# Patient Record
Sex: Male | Born: 1955 | Race: White | Hispanic: No | Marital: Married | Smoking: Never smoker
Health system: Southern US, Community
[De-identification: ages and names within clinical notes are randomized; demographics above are authoritative.]

## PROBLEM LIST (undated history)

## (undated) DIAGNOSIS — T7840XA Allergy, unspecified, initial encounter: Secondary | ICD-10-CM

## (undated) DIAGNOSIS — E079 Disorder of thyroid, unspecified: Secondary | ICD-10-CM

## (undated) DIAGNOSIS — E039 Hypothyroidism, unspecified: Secondary | ICD-10-CM

## (undated) DIAGNOSIS — I10 Essential (primary) hypertension: Secondary | ICD-10-CM

## (undated) DIAGNOSIS — Z87442 Personal history of urinary calculi: Secondary | ICD-10-CM

## (undated) HISTORY — PX: OTHER SURGICAL HISTORY: SHX169

## (undated) HISTORY — PX: WISDOM TOOTH EXTRACTION: SHX21

## (undated) HISTORY — DX: Allergy, unspecified, initial encounter: T78.40XA

---

## 2001-06-25 ENCOUNTER — Ambulatory Visit (HOSPITAL_BASED_OUTPATIENT_CLINIC_OR_DEPARTMENT_OTHER): Admission: RE | Admit: 2001-06-25 | Discharge: 2001-06-25 | Payer: Self-pay | Admitting: Plastic Surgery

## 2001-06-25 ENCOUNTER — Encounter (INDEPENDENT_AMBULATORY_CARE_PROVIDER_SITE_OTHER): Payer: Self-pay | Admitting: Specialist

## 2010-10-26 ENCOUNTER — Encounter: Payer: Self-pay | Admitting: Cardiovascular Disease

## 2011-05-18 NOTE — Op Note (Signed)
[place]  Patient:    Richard Avery, Richard Avery                        MRN: [id number] Proc. Date: 06/25/01 Attending:  Alfredia Ferguson, M.D.                           Operative Report  PREOPERATIVE DIAGNOSES: 1. A 5 mm pigmented nevus, upper midforehead. 2. A 2 mm pigmented nevus, upper left nasal dorsum. 3. An 8 mm seborrheic keratosis, right temple. 4. Skin tag x 2, left lateral neck.  POSTOPERATIVE DIAGNOSES: 1. A 5 mm pigmented nevus, upper midforehead. 2. A 2 mm pigmented nevus, upper left nasal dorsum. 3. An 8 mm seborrheic keratosis, right temple. 4. Skin tag x 2, left lateral neck.  PROCEDURES: 1. Excision of pigmented nevus, upper midforehead. 2. Excision upper nasal dorsum lesion. 3. Curette and hyfrecation of right temple lesion. 4. Shave excision and cauterization of skin tags x 2, left neck.  SURGEON:  Alfredia Ferguson, M.D.  ANESTHESIA:  2% Xylocaine and 1:100,000 epinephrine.  INDICATION FOR SURGERY:  This is a 55 year old gentleman with multiple lesions he wishes to have removed.  He understands he will be trading what he has for a permanent, potentially unsightly scar.  In spite of that, he wishes to proceed with the surgery.  DESCRIPTION OF PROCEDURE:  Skin marks were placed in elliptical fashion around the skin lesion in his upper midforehead and also around the lesion on his left upper nasal dorsum.  Local anesthesia was infiltrated in the two above-described lesions and also in the area of the seborrheic keratosis on his right temple and the two lesions on his neck.  The face was first prepped and draped in a sterile fashion.  Attention was first directed to the lesion on his right temple.  This was cauterized under very low voltage to char the surface.  The surface was then curetted, and the base was cauterized a second time very lightly.  Attention was directed to the forehead lesion.  The lesion was excised in elliptical  fashion with about 1 mm margin.  The lesion was passed off for pathology.  Hemostasis was accomplished using electrocautery. Wound edges were undermined for a distance of several millimeters in all directions.  The wound was closed using interrupted 5-0 Vicryl suture for the dermis and a running 6-0 nylon suture for the skin edges.  Excision of the lesion in his upper left nasal dorsum was then carried out in an elliptical fashion.  The lesion was passed off for pathology.  Hemostasis was accomplished using pressure.  The edges of the wound were then reapproximated using an interrupted 6-0 nylon suture.  The left neck lesions were now prepped and draped in a sterile fashion.  The skin tags were shaved at their base and cauterized.  Both were removed.  The larger of the two lesions, which was more lateral or posterior on the neck, had some substance to the lesion, and perhaps may have been more than a skin tag.  I opted to send it to pathology to rule out any significant lesion.  Light dressings were applied to all areas.  The patient tolerated the procedure well with minimal blood loss.  He was discharged to home in satisfactory condition. DD:  06/25/01 TD:  06/25/01 Job: [id number]

## 2013-12-14 ENCOUNTER — Encounter (INDEPENDENT_AMBULATORY_CARE_PROVIDER_SITE_OTHER): Payer: Self-pay | Admitting: Physician Assistant

## 2013-12-14 NOTE — Progress Notes (Signed)
This encounter was created in error - please disregard.

## 2019-02-07 ENCOUNTER — Emergency Department (HOSPITAL_BASED_OUTPATIENT_CLINIC_OR_DEPARTMENT_OTHER): Payer: BLUE CROSS/BLUE SHIELD

## 2019-02-07 ENCOUNTER — Emergency Department (HOSPITAL_BASED_OUTPATIENT_CLINIC_OR_DEPARTMENT_OTHER)
Admission: EM | Admit: 2019-02-07 | Discharge: 2019-02-07 | Disposition: A | Discharge status: Home / Self Care | Payer: BLUE CROSS/BLUE SHIELD | Attending: Emergency Medicine | Admitting: Emergency Medicine

## 2019-02-07 ENCOUNTER — Other Ambulatory Visit: Payer: Self-pay

## 2019-02-07 ENCOUNTER — Encounter (HOSPITAL_BASED_OUTPATIENT_CLINIC_OR_DEPARTMENT_OTHER): Payer: Self-pay | Admitting: Emergency Medicine

## 2019-02-07 DIAGNOSIS — N201 Calculus of ureter: Secondary | ICD-10-CM | POA: Diagnosis not present

## 2019-02-07 DIAGNOSIS — I10 Essential (primary) hypertension: Secondary | ICD-10-CM | POA: Insufficient documentation

## 2019-02-07 DIAGNOSIS — Z79899 Other long term (current) drug therapy: Secondary | ICD-10-CM | POA: Insufficient documentation

## 2019-02-07 DIAGNOSIS — R109 Unspecified abdominal pain: Secondary | ICD-10-CM | POA: Diagnosis present

## 2019-02-07 HISTORY — DX: Disorder of thyroid, unspecified: E07.9

## 2019-02-07 HISTORY — DX: Essential (primary) hypertension: I10

## 2019-02-07 LAB — CBC WITH DIFFERENTIAL/PLATELET
Abs Immature Granulocytes: 0.04 10*3/uL (ref 0.00–0.07)
Basophils Absolute: 0.1 10*3/uL (ref 0.0–0.1)
Basophils Relative: 0 %
Eosinophils Absolute: 0 10*3/uL (ref 0.0–0.5)
Eosinophils Relative: 0 %
HEMATOCRIT: 46.2 % (ref 39.0–52.0)
Hemoglobin: 15.5 g/dL (ref 13.0–17.0)
Immature Granulocytes: 0 %
Lymphocytes Relative: 12 %
Lymphs Abs: 1.6 10*3/uL (ref 0.7–4.0)
MCH: 30.6 pg (ref 26.0–34.0)
MCHC: 33.5 g/dL (ref 30.0–36.0)
MCV: 91.1 fL (ref 80.0–100.0)
MONOS PCT: 9 %
Monocytes Absolute: 1.2 10*3/uL — ABNORMAL HIGH (ref 0.1–1.0)
Neutro Abs: 10.4 10*3/uL — ABNORMAL HIGH (ref 1.7–7.7)
Neutrophils Relative %: 79 %
Platelets: 244 10*3/uL (ref 150–400)
RBC: 5.07 MIL/uL (ref 4.22–5.81)
RDW: 13.4 % (ref 11.5–15.5)
WBC: 13.3 10*3/uL — ABNORMAL HIGH (ref 4.0–10.5)
nRBC: 0 % (ref 0.0–0.2)

## 2019-02-07 LAB — BASIC METABOLIC PANEL
Anion gap: 10 (ref 5–15)
BUN: 11 mg/dL (ref 8–23)
CALCIUM: 9.6 mg/dL (ref 8.9–10.3)
CO2: 21 mmol/L — ABNORMAL LOW (ref 22–32)
Chloride: 105 mmol/L (ref 98–111)
Creatinine, Ser: 1.02 mg/dL (ref 0.61–1.24)
GFR calc Af Amer: >60 mL/min (ref >60)
GFR calc non Af Amer: >60 mL/min (ref >60)
Glucose, Bld: 107 mg/dL — ABNORMAL HIGH (ref 70–99)
Potassium: 4.1 mmol/L (ref 3.5–5.1)
Sodium: 136 mmol/L (ref 135–145)

## 2019-02-07 LAB — URINALYSIS, MICROSCOPIC (REFLEX)

## 2019-02-07 LAB — URINALYSIS, ROUTINE W REFLEX MICROSCOPIC
Bilirubin Urine: NEGATIVE
GLUCOSE, UA: NEGATIVE mg/dL
Ketones, ur: NEGATIVE mg/dL
NITRITE: NEGATIVE
PH: 6 (ref 5.0–8.0)
Protein, ur: NEGATIVE mg/dL
SPECIFIC GRAVITY, URINE: 1.015 (ref 1.005–1.030)

## 2019-02-07 MED ORDER — TAMSULOSIN HCL 0.4 MG PO CAPS: 0.4000 mg | ORAL_CAPSULE | Freq: Every day | ORAL | 0 refills | Status: DC

## 2019-02-07 MED ORDER — HYDROCODONE-ACETAMINOPHEN 5-325 MG PO TABS: 1.0000 | ORAL_TABLET | ORAL | 0 refills | Status: DC | PRN

## 2019-02-07 NOTE — ED Triage Notes (Signed)
LLQ pain radiating into his back x 4 days. Sent from [facility]. Endorses N/v.

## 2019-02-07 NOTE — ED Provider Notes (Signed)
[facility] EMERGENCY DEPARTMENT Provider Note   CSN: [id number] Arrival date & time: 02/07/19  1035     History   Chief Complaint Chief Complaint  Patient presents with  . Abdominal Pain    HPI Richard Avery is a 62 y.o. male.  HPI  63 year old male presents with left-sided abdominal and back pain.  Originally started about 3 days ago all of a sudden.  Was bad for about 5 minutes and then went away.  Came back again a couple days ago to the point that he had to cancel his day.  Did okay yesterday but then came back in the middle the night/early this morning.  He was at urgent care earlier and they sent him over here.  He states while at urgent care he was having a very hard time finding a comfortable position but currently he is feeling much better and the pain is minimal to moderate.  He took some Tylenol this morning.  He vomited once a few days ago but none since.  No burning when he urinates or blood in his urine.  No diarrhea or fevers.  Past Medical History:  Diagnosis Date  . Allergy   . Hypertension   . Thyroid disease     There are no active problems to display for this patient.   Past Surgical History:  Procedure Laterality Date  . 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 Authorizing Provider  HYDROcodone-acetaminophen (NORCO) 5-325 MG tablet Take 1 tablet by mouth every 4 (four) hours as needed for severe pain. 02/07/19   Sherwood Gambler, MD  tamsulosin (FLOMAX) 0.4 MG CAPS capsule Take 1 capsule (0.4 mg total) by mouth daily. 02/07/19   Sherwood Gambler, MD  triamterene-hydrochlorothiazide (DYAZIDE) 37.5-25 MG per capsule Take 1 capsule by mouth daily.    [provider]    Family History No family history on file.  Social History Social History   Tobacco Use  . Smoking status: Never Smoker  . Smokeless tobacco: Never Used  Substance Use  Topics  . Alcohol use: Yes    Comment: social  . Drug use: No     Allergies   Patient has no known allergies.   Review of Systems Review of Systems  Constitutional: Negative for fever.  Gastrointestinal: Positive for abdominal pain and vomiting. Negative for diarrhea and nausea.  Genitourinary: Positive for flank pain. Negative for dysuria, hematuria, scrotal swelling and testicular pain.  Musculoskeletal: Positive for back pain.  All other systems reviewed and are negative.    Physical Exam Updated Vital Signs BP 135/87 (BP Location: Right Arm)   Pulse 88   Temp 98.3 F (36.8 C) (Oral)   Resp 18   Ht 5\' 6"  (1.676 m)   Wt 78.5 kg   SpO2 100%   BMI 27.92 kg/m   Physical Exam Vitals signs and nursing note reviewed.  Constitutional:      General: He is not in acute distress.    Appearance: He is well-developed. He is not ill-appearing or diaphoretic.  HENT:     Head: Normocephalic and atraumatic.     Right Ear: External ear normal.     Left Ear: External ear normal.     Nose: Nose normal.  Eyes:     General:        Right eye: No discharge.  Left eye: No discharge.  Neck:     Musculoskeletal: Neck supple.  Cardiovascular:     Rate and Rhythm: Normal rate and regular rhythm.     Heart sounds: Normal heart sounds.  Pulmonary:     Effort: Pulmonary effort is normal.     Breath sounds: Normal breath sounds.  Abdominal:     Palpations: Abdomen is soft.     Tenderness: There is no abdominal tenderness. There is no right CVA tenderness or left CVA tenderness.  Skin:    General: Skin is warm and dry.  Neurological:     Mental Status: He is alert.  Psychiatric:        Mood and Affect: Mood is not anxious.      ED Treatments / Results  Labs (all labs ordered are listed, but only abnormal results are displayed) Labs Reviewed  URINALYSIS, ROUTINE W REFLEX MICROSCOPIC - Abnormal; Notable for the following components:      Result Value   Hgb urine  dipstick TRACE (*)    Leukocytes, UA SMALL (*)    All other components within normal limits  URINALYSIS, MICROSCOPIC (REFLEX) - Abnormal; Notable for the following components:   Bacteria, UA FEW (*)    All other components within normal limits  BASIC METABOLIC PANEL - Abnormal; Notable for the following components:   CO2 21 (*)    Glucose, Bld 107 (*)    All other components within normal limits  CBC WITH DIFFERENTIAL/PLATELET - Abnormal; Notable for the following components:   WBC 13.3 (*)    Neutro Abs 10.4 (*)    Monocytes Absolute 1.2 (*)    All other components within normal limits  URINE CULTURE    EKG None  Radiology Ct Renal Stone Study  Result Date: 02/07/2019 CLINICAL DATA:  Left flank pain for 3 days EXAM: CT ABDOMEN AND PELVIS WITHOUT CONTRAST TECHNIQUE: Multidetector CT imaging of the abdomen and pelvis was performed following the standard protocol without IV contrast. COMPARISON:  None. FINDINGS: Lower chest:  No contributory findings. Hepatobiliary: No focal liver abnormality.No evidence of biliary obstruction or stone. Pancreas: Unremarkable. Spleen: Unremarkable. Adrenals/Urinary Tract: Negative adrenals. 4 mm stone in the left ureter projecting at the L4 level with moderate hydroureteronephrosis. Left renal sinus cyst is also present. Tiny left renal calculus. Unremarkable bladder. Stomach/Bowel:  No obstruction. Negative for bowel inflammation Vascular/Lymphatic: No acute vascular abnormality. No mass or adenopathy. Reproductive:Incidental prostatic calcification. Other: No ascites or pneumoperitoneum. Musculoskeletal: No acute abnormalities. IMPRESSION: 1. Left hydroureteronephrosis due to a 4 mm mid ureteric stone. 2. Punctate left nephrolithiasis. Electronically Signed   By: Monte Fantasia M.D.   On: 02/07/2019 12:10    Procedures Procedures (including critical care time)  Medications Ordered in ED Medications - No data to display   Initial Impression /  Assessment and Plan / ED Course  I have reviewed the triage vital signs and the nursing notes.  Pertinent labs & imaging results that were available during my care of the patient were reviewed by me and considered in my medical decision making (see chart for details).     CT confirms left ureteral stone.  At 4 mm it should be passable.  He has not required any pain medicine here and appears comfortable.  Urine shows 6-10 WBCs but no signs or symptoms of acute urinary tract infection and he overall appears pretty well.  I doubt this is an acute infected stone.  I will advise him to follow-up with urology.  He is advised use ibuprofen and Tylenol and has been given hydrocodone for breakthrough pain.  He is specifically asking for Flomax.  I discussed possible side effects of this including dehydration.  We discussed that it is benefit is more for the larger stones but he is insistent.  I will give him a short course of this and advised him to drink plenty of fluids.  We discussed return precautions.  Final Clinical Impressions(s) / ED Diagnoses   Final diagnoses:  Left ureteral stone    ED Discharge Orders         Ordered    HYDROcodone-acetaminophen (NORCO) 5-325 MG tablet  Every 4 hours PRN     02/07/19 1246    tamsulosin (FLOMAX) 0.4 MG CAPS capsule  Daily     02/07/19 1249           Sherwood Gambler, MD 02/07/19 1437

## 2019-02-07 NOTE — Discharge Instructions (Signed)
You develop fever, vomiting, uncontrolled pain, burning with urination or other urinary tract infection symptoms, or any other new/concerning symptoms then return to the ER for evaluation.

## 2019-02-09 LAB — URINE CULTURE: Culture: NO GROWTH

## 2019-02-13 ENCOUNTER — Other Ambulatory Visit: Payer: Self-pay | Admitting: Urology

## 2019-02-13 ENCOUNTER — Encounter (HOSPITAL_COMMUNITY): Payer: Self-pay | Admitting: *Deleted

## 2019-02-16 ENCOUNTER — Encounter (HOSPITAL_COMMUNITY): Payer: Self-pay | Admitting: General Practice

## 2019-02-16 ENCOUNTER — Ambulatory Visit (HOSPITAL_COMMUNITY): Payer: BLUE CROSS/BLUE SHIELD

## 2019-02-16 ENCOUNTER — Encounter (HOSPITAL_COMMUNITY)
Admission: RE | Disposition: A | Discharge status: Home / Self Care | Payer: Self-pay | Source: Home / Self Care | Attending: Urology

## 2019-02-16 ENCOUNTER — Ambulatory Visit (HOSPITAL_COMMUNITY)
Admission: RE | Admit: 2019-02-16 | Discharge: 2019-02-16 | Disposition: A | Discharge status: Home / Self Care | Payer: BLUE CROSS/BLUE SHIELD | Attending: Urology | Admitting: Urology

## 2019-02-16 DIAGNOSIS — N201 Calculus of ureter: Secondary | ICD-10-CM | POA: Insufficient documentation

## 2019-02-16 DIAGNOSIS — N200 Calculus of kidney: Secondary | ICD-10-CM

## 2019-02-16 DIAGNOSIS — Z79890 Hormone replacement therapy (postmenopausal): Secondary | ICD-10-CM | POA: Diagnosis not present

## 2019-02-16 DIAGNOSIS — I10 Essential (primary) hypertension: Secondary | ICD-10-CM | POA: Insufficient documentation

## 2019-02-16 HISTORY — DX: Hypothyroidism, unspecified: E03.9

## 2019-02-16 HISTORY — DX: Personal history of urinary calculi: Z87.442

## 2019-02-16 HISTORY — PX: EXTRACORPOREAL SHOCK WAVE LITHOTRIPSY: SHX1557

## 2019-02-16 SURGERY — LITHOTRIPSY, ESWL
Anesthesia: LOCAL | Laterality: Left

## 2019-02-16 MED ORDER — DIPHENHYDRAMINE HCL 25 MG PO CAPS
25.0000 mg | ORAL_CAPSULE | ORAL | Status: AC
  Administered 2019-02-16: 25 mg via ORAL
  Filled 2019-02-16: qty 1

## 2019-02-16 MED ORDER — SODIUM CHLORIDE 0.9 % IV SOLN
INTRAVENOUS | Status: DC
  Administered 2019-02-16: 08:00:00 via INTRAVENOUS

## 2019-02-16 MED ORDER — TAMSULOSIN HCL 0.4 MG PO CAPS: 0.4000 mg | ORAL_CAPSULE | Freq: Every day | ORAL | 3 refills | Status: AC

## 2019-02-16 MED ORDER — DIAZEPAM 5 MG PO TABS
10.0000 mg | ORAL_TABLET | ORAL | Status: AC
  Administered 2019-02-16: 10 mg via ORAL
  Filled 2019-02-16: qty 2

## 2019-02-16 MED ORDER — CIPROFLOXACIN HCL 500 MG PO TABS
500.0000 mg | ORAL_TABLET | ORAL | Status: AC
  Administered 2019-02-16: 500 mg via ORAL
  Filled 2019-02-16: qty 1

## 2019-02-16 NOTE — H&P (Signed)
See [facility] H&P

## 2019-02-16 NOTE — Discharge Instructions (Signed)
See [facility] Discharge Instruction Sheet

## 2019-02-16 NOTE — Op Note (Signed)
See [facility] OP note scanned into chart. Also because of the size, density, location and other factors that cannot be anticipated I feel this will likely be a staged procedure. This fact supersedes any indication in the scanned [facility] operative note to the contrary.  

## 2019-02-17 ENCOUNTER — Encounter (HOSPITAL_COMMUNITY): Payer: Self-pay | Admitting: Urology

## 2019-08-17 ENCOUNTER — Other Ambulatory Visit: Payer: Self-pay

## 2019-08-17 DIAGNOSIS — Z20822 Contact with and (suspected) exposure to covid-19: Secondary | ICD-10-CM

## 2019-08-18 LAB — NOVEL CORONAVIRUS, NAA: SARS-CoV-2, NAA: NOT DETECTED

## 2019-11-25 ENCOUNTER — Other Ambulatory Visit: Payer: Self-pay

## 2019-11-25 DIAGNOSIS — Z20822 Contact with and (suspected) exposure to covid-19: Secondary | ICD-10-CM

## 2019-11-27 LAB — NOVEL CORONAVIRUS, NAA: SARS-CoV-2, NAA: NOT DETECTED

## 2020-02-29 ENCOUNTER — Ambulatory Visit: Payer: Medicare Other | Attending: Internal Medicine

## 2020-02-29 DIAGNOSIS — Z20822 Contact with and (suspected) exposure to covid-19: Secondary | ICD-10-CM

## 2020-03-01 LAB — NOVEL CORONAVIRUS, NAA: SARS-CoV-2, NAA: NOT DETECTED

## 2020-09-11 ENCOUNTER — Ambulatory Visit: Payer: Medicare Other | Attending: Internal Medicine

## 2020-09-11 ENCOUNTER — Other Ambulatory Visit: Payer: Self-pay

## 2020-09-11 DIAGNOSIS — Z23 Encounter for immunization: Secondary | ICD-10-CM

## 2020-09-11 NOTE — Progress Notes (Signed)
   Covid-19 Vaccination Clinic  Name:  Princ[place] Nabor    MRN: [id number] DOB: September 23, 1956  09/11/2020  Mr. Packett was observed post Covid-19 immunization for 15 minutes without incident. He was provided with Vaccine Information Sheet and instruction to access the V-Safe system.   Mr. Claw was instructed to call 911 with any severe reactions post vaccine: Marland Kitchen Difficulty breathing  . Swelling of face and throat  . A fast heartbeat  . A bad rash all over body  . Dizziness and weakness

## 2020-10-19 ENCOUNTER — Ambulatory Visit (INDEPENDENT_AMBULATORY_CARE_PROVIDER_SITE_OTHER): Payer: Medicare Other | Admitting: Dermatology

## 2020-10-19 ENCOUNTER — Encounter: Payer: Self-pay | Admitting: Dermatology

## 2020-10-19 ENCOUNTER — Other Ambulatory Visit: Payer: Self-pay

## 2020-10-19 DIAGNOSIS — L570 Actinic keratosis: Secondary | ICD-10-CM | POA: Diagnosis not present

## 2020-10-19 DIAGNOSIS — L821 Other seborrheic keratosis: Secondary | ICD-10-CM | POA: Diagnosis not present

## 2020-10-19 DIAGNOSIS — D3610 Benign neoplasm of peripheral nerves and autonomic nervous system, unspecified: Secondary | ICD-10-CM

## 2020-10-19 DIAGNOSIS — Z1283 Encounter for screening for malignant neoplasm of skin: Secondary | ICD-10-CM

## 2020-10-19 DIAGNOSIS — B079 Viral wart, unspecified: Secondary | ICD-10-CM

## 2020-10-19 DIAGNOSIS — D3617 Benign neoplasm of peripheral nerves and autonomic nervous system of trunk, unspecified: Secondary | ICD-10-CM

## 2020-10-19 DIAGNOSIS — L851 Acquired keratosis [keratoderma] palmaris et plantaris: Secondary | ICD-10-CM

## 2020-11-28 ENCOUNTER — Encounter: Payer: Self-pay | Admitting: Dermatology

## 2020-11-28 NOTE — Progress Notes (Signed)
   Follow-Up Visit   Subjective  Richard Avery is a 64 y.o. male who presents for the following: Annual Exam (right neck- has not gone away- no new concerns).  General skin check Location:  Duration:  Quality:  Associated Signs/Symptoms: Modifying Factors:  Severity:  Timing: Context:   Objective  Well appearing patient in no apparent distress; mood and affect are within normal limits.  A full examination was performed including scalp, head, eyes, ears, nose, lips, neck, chest, axillae, abdomen, back, buttocks, bilateral upper extremities, bilateral lower extremities, hands, feet, fingers, toes, fingernails, and toenails. All findings within normal limits unless otherwise noted below.   Assessment & Plan    Skin exam for malignant neoplasm (2) Chest - Medial [facility]); Mid Back  Yearly skin examination, self examine twice annually.  Solar keratosis (2) Left Preauricular Area; Left Malar Cheek  Schedule patient for a PDT  Seborrheic keratosis Right Popliteal Fossa  Benign no treatment needed.  Viral warts, unspecified type Left Thigh - Anterior  Benign no treatment needed unless patient wants removed.  Stucco keratosis Right Shoulder - Anterior  Benign no treatment needed  AK (actinic keratosis) Head - Anterior (Face)  Schedule for PDT- Red light 90 minutes.  ST will want to see patient to determine if he still wants patient to do the red light.  Neurofibroma Left Inframammary Fold  Benign no treatment needed unless patient wants removed or clinically changes     I, Lavonna Monarch, MD, have reviewed all documentation for this visit.  The documentation on 11/28/20 for the exam, diagnosis, procedures, and orders are all accurate and complete.

## 2020-12-14 NOTE — Addendum Note (Signed)
Addended by: Lavonna Monarch on: 12/14/2020 06:51 AM   Modules accepted: Level of Service

## 2021-05-18 ENCOUNTER — Other Ambulatory Visit: Payer: Self-pay | Admitting: *Deleted

## 2021-05-18 DIAGNOSIS — I119 Hypertensive heart disease without heart failure: Secondary | ICD-10-CM

## 2021-05-18 DIAGNOSIS — E782 Mixed hyperlipidemia: Secondary | ICD-10-CM

## 2021-05-18 NOTE — Progress Notes (Signed)
Order placed for Cardiac Calcium scoring as requested.

## 2021-06-21 ENCOUNTER — Other Ambulatory Visit: Payer: Self-pay

## 2021-06-21 ENCOUNTER — Ambulatory Visit (INDEPENDENT_AMBULATORY_CARE_PROVIDER_SITE_OTHER)
Admission: RE | Admit: 2021-06-21 | Discharge: 2021-06-21 | Disposition: A | Discharge status: Home / Self Care | Payer: Self-pay | Source: Ambulatory Visit | Attending: Cardiovascular Disease | Admitting: Cardiovascular Disease

## 2021-06-21 DIAGNOSIS — I119 Hypertensive heart disease without heart failure: Secondary | ICD-10-CM

## 2021-06-21 DIAGNOSIS — E782 Mixed hyperlipidemia: Secondary | ICD-10-CM

## 2021-11-05 IMAGING — CT CT CARDIAC CORONARY ARTERY CALCIUM SCORE
3 series · 14 of 20 positions shown, 15 images · non-contrast
Comparison: 10/26/2010 coronary CTA.
COMPARISON: 10/26/2010 coronary CTA.

Addendum:
EXAM:
OVER-READ INTERPRETATION  CT CHEST

The following report is an over-read performed by radiologist Dr.
Jalman Fa [REDACTED] on 06/21/2021. This over-read
does not include interpretation of cardiac or coronary anatomy or
pathology. The calcium score interpretation by the cardiologist is
attached.
CLINICAL DATA: Cardiovascular Disease Risk stratification
Coronary Calcium Score
TECHNIQUE: A gated, non-contrast computed tomography scan of the heart was
performed using 3mm slice thickness. Axial images were analyzed on a
dedicated workstation. Calcium scoring of the coronary arteries was
performed using the Agatston method.

[Series 2: casc 3.0 bv41 2 bestsyst 29 % · axial · 0.46mm/px · z∈[+1413,+1485]mm · 4 of 40 slices shown, 5 images]
[im 8/40  vessel]
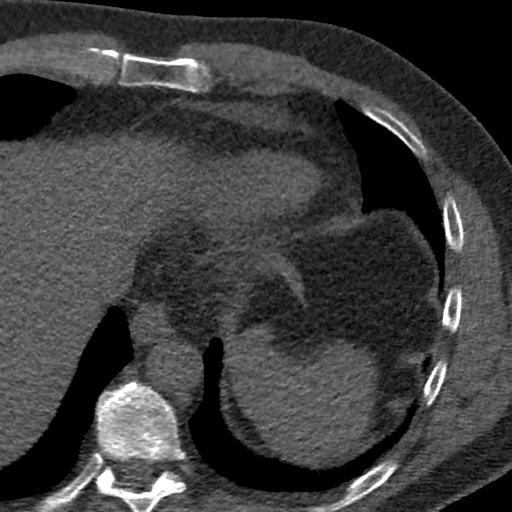
[im 8/40  lung]
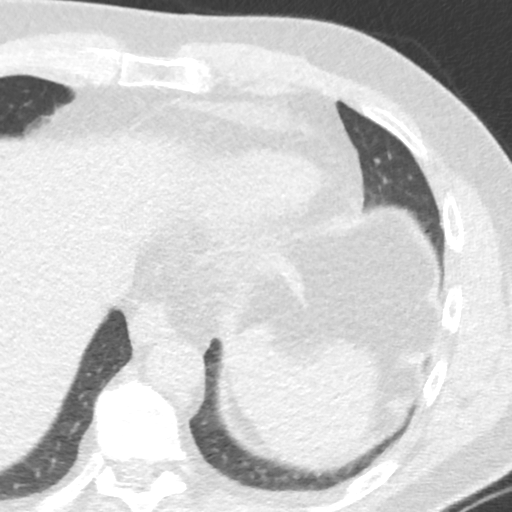
[im 16/40  vessel]
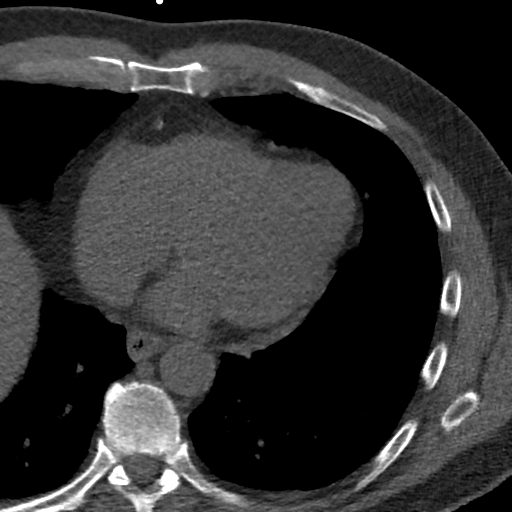
[im 24/40  vessel]
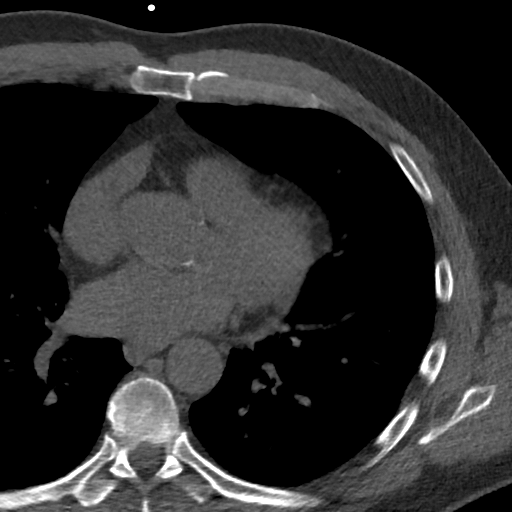
[im 32/40  vessel]
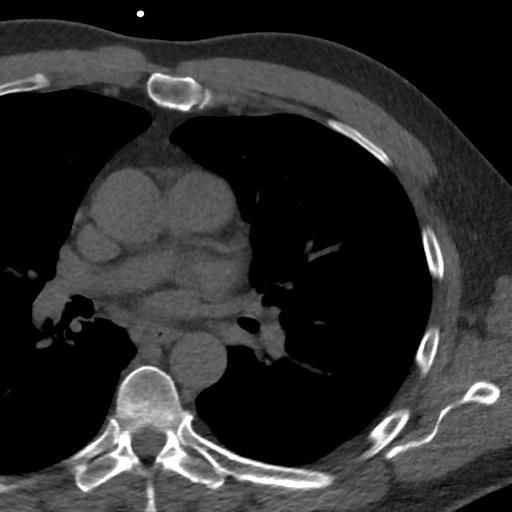

[Series 3: lung 29 % · axial · 0.67mm/px · z∈[+1410,+1488]mm · 5 of 40 slices shown]
[im 7/40  lung]
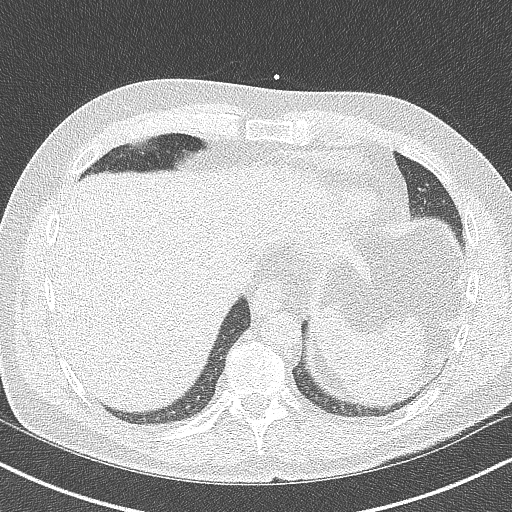
[im 14/40  lung]
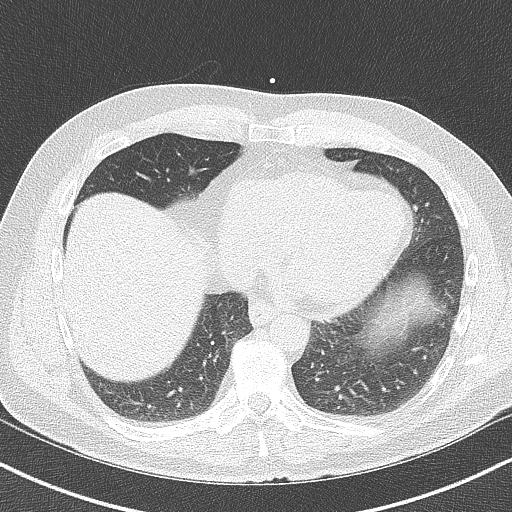
[im 20/40  lung]
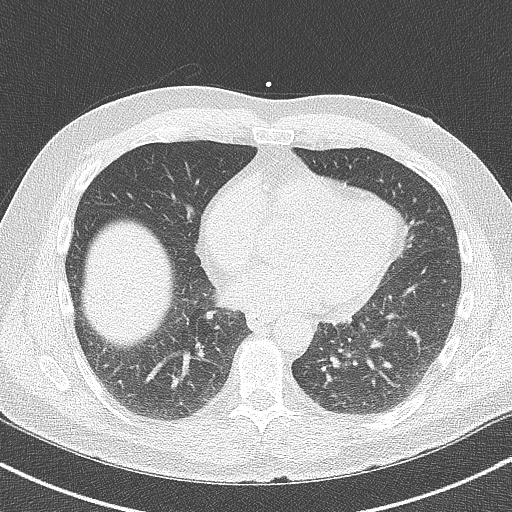
[im 27/40  lung]
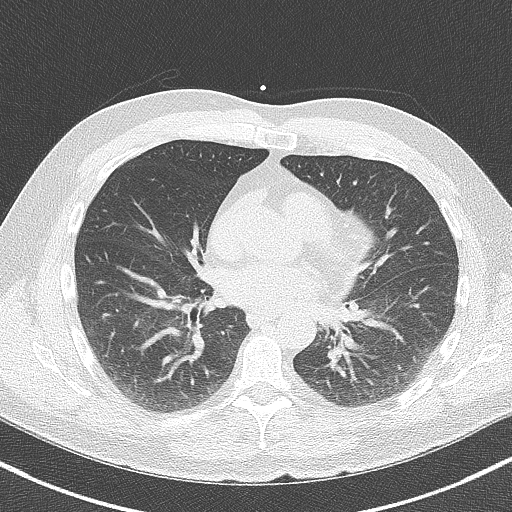
[im 33/40  lung]
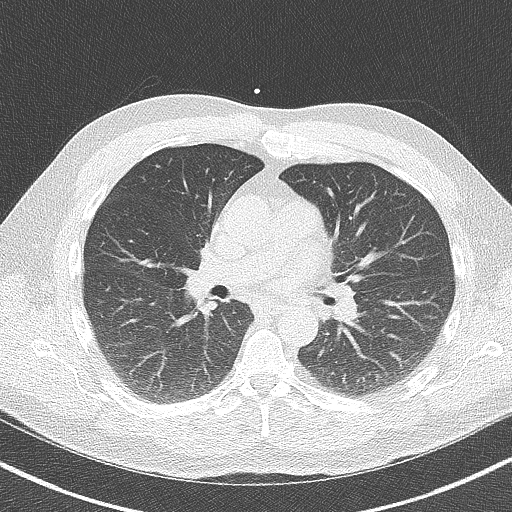

[Series 4: lung st 29 % · axial · 0.67mm/px · z∈[+1410,+1488]mm · 5 of 40 slices shown]
[im 7/40  lung]
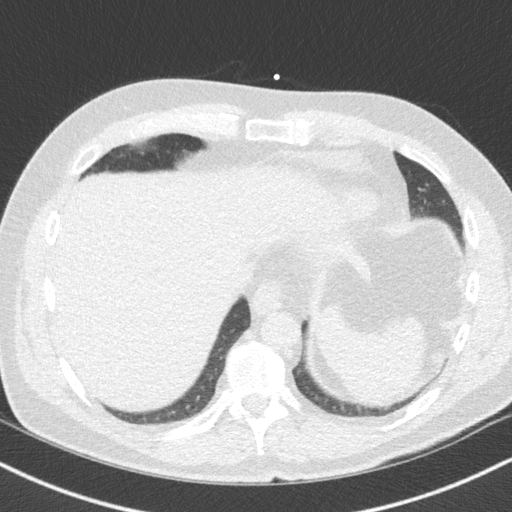
[im 14/40  lung]
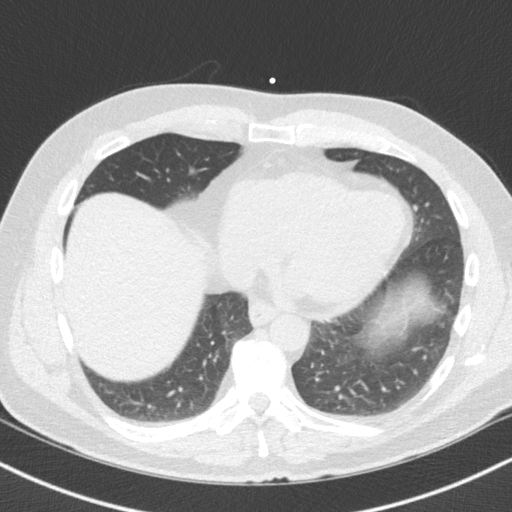
[im 20/40  lung]
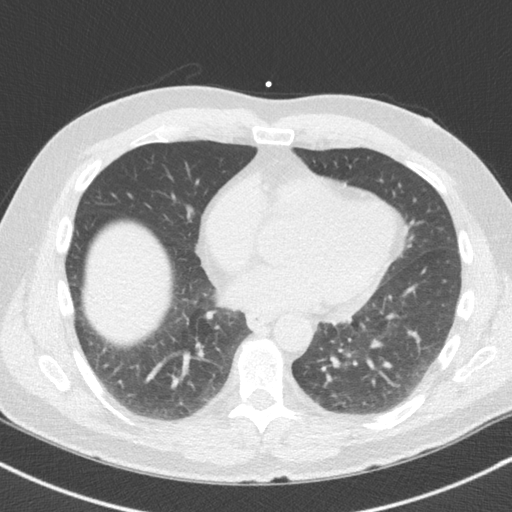
[im 27/40  lung]
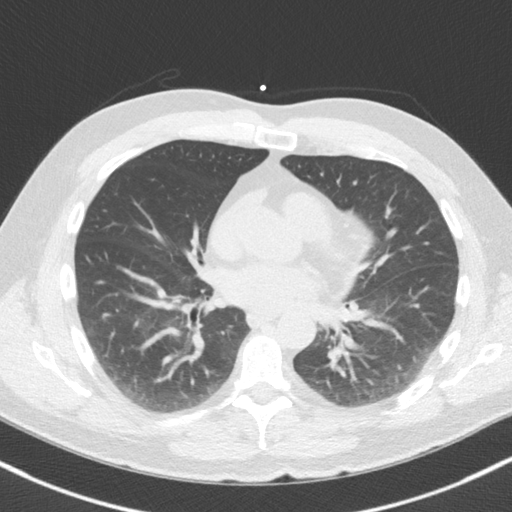
[im 33/40  lung]
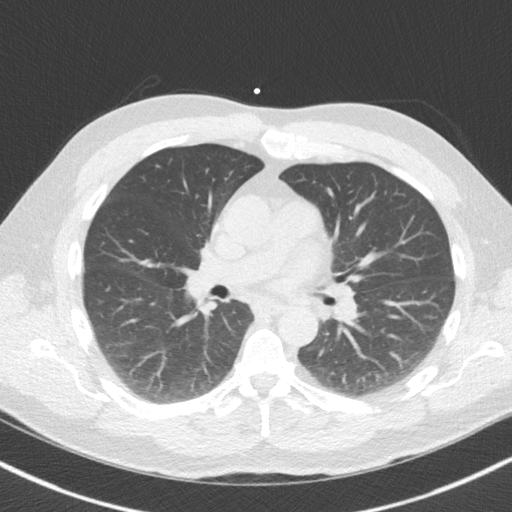

[14 of 20 positions shown; findings below may reference images not displayed]

FINDINGS: Vascular: Normal aortic caliber.

Mediastinum/Nodes: No imaged thoracic adenopathy.

Lungs/Pleura: No pleural fluid.  Clear imaged lungs.

Upper Abdomen: Normal imaged portions of the liver, spleen, stomach,
gallbladder.

Musculoskeletal: No acute osseous abnormality.
IMPRESSION: No acute findings in the imaged extracardiac chest.
FINDINGS: Coronary arteries: Normal origins.

Coronary Calcium Score:

Left main: 0

Left anterior descending artery: 0

Left circumflex artery:

Right coronary artery:

Total: 6

Percentile: 22nd

Pericardium: Normal.

Mild calcification of the aortic valve (leaflet tips and base) -
aortic valve calcium score of 52.5. Valve appears trileaflet.

Ascending Aorta: Normal caliber.

Non-cardiac: See separate report from [REDACTED].
IMPRESSION: 1. Coronary calcium score of 6. This was 22nd percentile for age-,
race-, and sex-matched controls.
2. Mild calcification of the aortic valve (leaflet tips and base) -
aortic valve calcium score of 52.5. Valve appears trileaflet.



If CAC=0, it is reasonable to withhold statin therapy and reassess
in 5 to 10 years, as long as higher risk conditions are absent
(diabetes mellitus, family history of premature CHD in first degree
relatives (males <55 years; females <65 years), cigarette smoking,
or LDL >=190 mg/dL).

If CAC is 1 to 99, it is reasonable to initiate statin therapy for
patients >=55 years of age.

If CAC is >=100 or >=75th percentile, it is reasonable to initiate
statin therapy at any age.

Cardiology referral should be considered for patients with CAC
scores >=400 or >=75th percentile.

*0615 AHA/ACC/AACVPR/AAPA/ABC/LAMEEN/MIKAELA/VERMEULEN/Ahmadzi/NORDSTROM/SUMITRA/YOEL
Guideline on the Management of Blood Cholesterol: A Report of the
American College of Cardiology/American Heart Association Task Force
on Clinical Practice Guidelines. J Am Coll Cardiol.
2911;73(24):7289-7730.

*** End of Addendum ***
EXAM:
OVER-READ INTERPRETATION  CT CHEST

The following report is an over-read performed by radiologist Dr.
Jalman Fa [REDACTED] on 06/21/2021. This over-read
does not include interpretation of cardiac or coronary anatomy or
pathology. The calcium score interpretation by the cardiologist is
attached.
FINDINGS: Vascular: Normal aortic caliber.

Mediastinum/Nodes: No imaged thoracic adenopathy.

Lungs/Pleura: No pleural fluid.  Clear imaged lungs.

Upper Abdomen: Normal imaged portions of the liver, spleen, stomach,
gallbladder.

Musculoskeletal: No acute osseous abnormality.
IMPRESSION: No acute findings in the imaged extracardiac chest.

## 2022-10-17 ENCOUNTER — Other Ambulatory Visit (HOSPITAL_BASED_OUTPATIENT_CLINIC_OR_DEPARTMENT_OTHER): Payer: Self-pay

## 2022-10-17 MED ORDER — INFLUENZA VAC A&B SA ADJ QUAD 0.5 ML IM PRSY
PREFILLED_SYRINGE | INTRAMUSCULAR | 0 refills | Status: AC
  Filled 2022-10-17: qty 0.5, 1d supply, fill #0

## 2023-01-31 ENCOUNTER — Other Ambulatory Visit (HOSPITAL_BASED_OUTPATIENT_CLINIC_OR_DEPARTMENT_OTHER): Payer: Self-pay

## 2024-10-14 ENCOUNTER — Other Ambulatory Visit (HOSPITAL_BASED_OUTPATIENT_CLINIC_OR_DEPARTMENT_OTHER): Payer: Self-pay

## 2024-10-14 MED ORDER — FLUZONE HIGH-DOSE 0.5 ML IM SUSY
0.5000 mL | PREFILLED_SYRINGE | Freq: Once | INTRAMUSCULAR | 0 refills | Status: AC
  Filled 2024-10-14: qty 0.5, 1d supply, fill #0
